# Patient Record
Sex: Female | Born: 1980 | Race: White | Hispanic: No | Marital: Single | Smoking: Never smoker
Health system: Southern US, Community
[De-identification: ages and names within clinical notes are randomized; demographics above are authoritative.]

## PROBLEM LIST (undated history)

## (undated) HISTORY — PX: CERVICAL CONE BIOPSY: SUR198

## (undated) HISTORY — PX: OTHER SURGICAL HISTORY: SHX169

## (undated) HISTORY — PX: OTOPLASTY: SUR998

---

## 1999-12-22 DIAGNOSIS — R87619 Unspecified abnormal cytological findings in specimens from cervix uteri: Secondary | ICD-10-CM | POA: Insufficient documentation

## 2010-01-03 ENCOUNTER — Ambulatory Visit: Payer: Self-pay | Admitting: Family Medicine

## 2011-11-11 ENCOUNTER — Ambulatory Visit: Payer: Self-pay | Admitting: Family Medicine

## 2014-10-31 ENCOUNTER — Encounter: Payer: Self-pay | Admitting: Sports Medicine

## 2014-10-31 ENCOUNTER — Ambulatory Visit (INDEPENDENT_AMBULATORY_CARE_PROVIDER_SITE_OTHER): Payer: BC Managed Care – PPO | Admitting: Sports Medicine

## 2014-10-31 VITALS — BP 115/73 | Ht 62.0 in | Wt 108.0 lb

## 2014-10-31 DIAGNOSIS — M79672 Pain in left foot: Secondary | ICD-10-CM | POA: Diagnosis not present

## 2014-10-31 DIAGNOSIS — M84375A Stress fracture, left foot, initial encounter for fracture: Secondary | ICD-10-CM

## 2014-10-31 NOTE — Patient Instructions (Signed)
Hip raises while laying on your side. Start 10 reps and work up to 3 sets of 30 on both sides  Calf stretches for the achilles.  Up with both feet and down on left foot over 5 seconds.

## 2014-10-31 NOTE — Progress Notes (Signed)
  Renee RivalChristina C Grippi - 33 y.o. female MRN [id number]  Date of birth: March 22, 1981  CC & HPI:  Trula OreChristina presents today on self-referral for evaluation of: Left foot pain: reports approximate 6 weeks of worsening left metatarsal foot pain. She had increased her activity over the summer and was participating in a regular running program. She's got to the point now that with any running greater than 5-10 minutes her foot is hurting and will continue to hurt following activity. Worse with the longer that she runs. Not waking her from sleep but bothersome when going to sleep. Has been taking anti-inflammatories.  Denies any skin changes, fever, chills or redness around the foot. Denies any pins and needles or electrical-like sensations.  ROS:  Per HPI.   HISTORY: Past Medical, Surgical, Social, and Family History Reviewed & Updated per EMR.  Pertinent Historical Findings include: Otherwise relatively healthy. Nonsmoker, social drinker, works as a Curatorhigher education consulting project manager.   OBJECTIVE:  VS:   HT:5\' 2"  (157.5 cm)   WT:108 lb (48.988 kg)  BMI:19.8          BP:115/73 mmHg  HR: bpm  TEMP: ( )  RESP:   PHYSICAL EXAM: GENERAL: Adult Caucasian  female. In no discomfort; no respiratory distress   PSYCH: alert and appropriate, good insight   NEURO: sensation is intact to light touch in bilateral lower extremities  VASCULAR: DP and PT pulses 2+/4.  No significant edema.   BILATERAL FOOT EXAM: Appearance: Overall normal-appearing, early bunion formation left greater than right Longitudinal Arch: moderate, flexible, maintains with weightbearing. Bilateral Morton's foot, no Morton's callus. Transverse Arch: left greater than right transverse arch breakdown Calcaneous position with weight bearing: neutral, slight valgus  Skin: No overlying erythema/ecchymosis.  Palpation: TTP over: distal metatarsal shaft of the third and fourth metatarsal  No TTP over: MTPs, first or second distal  metatarsal. Metatarsal Squeeze Test: negative  Strength & ROM: 5/5 Strength and full active ROM in: dorsiflexion, inversion, eversion.  Normal range of motion in toe motion, plantar flexion to 90 on left, 110 on right Bilateral hip abduction strengthening     Limited MSK Ultrasound of left foot: Findings: Increased neovascularity along the cortex of the distal third metatarsal. There is surrounding hypoechoic change with no cortical irregularity appreciated.  Impression: The above findings are consistent with early stress reaction of metatarsal    ASSESSMENT: 1. Left foot pain   2. Stress reaction of left foot, initial encounter     PLAN: See problem based charting & AVS for additional documentation. - patient provided green cushioned insoles today with additional metatarsal support bilaterally.  - Refrain from all running for the next 2 weeks.okay to try 5:1 WUJ:WJXBrun:walk in 2 weeks limiting to pain. Otherwise low impact activities encouraged. on pain levels. - HEP: hip abduction strengthening and Achilles stretching > Return in about 5 weeks (around 12/05/2014).

## 2014-11-01 DIAGNOSIS — M84375A Stress fracture, left foot, initial encounter for fracture: Secondary | ICD-10-CM | POA: Insufficient documentation

## 2014-12-12 ENCOUNTER — Ambulatory Visit: Payer: BC Managed Care – PPO | Admitting: Sports Medicine

## 2014-12-26 ENCOUNTER — Ambulatory Visit (INDEPENDENT_AMBULATORY_CARE_PROVIDER_SITE_OTHER): Payer: BLUE CROSS/BLUE SHIELD | Admitting: Sports Medicine

## 2014-12-26 ENCOUNTER — Encounter: Payer: Self-pay | Admitting: Sports Medicine

## 2014-12-26 VITALS — BP 107/71 | HR 54 | Ht 62.0 in | Wt 108.0 lb

## 2014-12-26 DIAGNOSIS — M84375A Stress fracture, left foot, initial encounter for fracture: Secondary | ICD-10-CM

## 2014-12-26 DIAGNOSIS — M79672 Pain in left foot: Secondary | ICD-10-CM | POA: Diagnosis not present

## 2014-12-26 NOTE — Patient Instructions (Signed)
2-3 minutes of running with 1 minute of walking Work up to 5 minutes by adding 1 minute each week until running 5 minutes with 1 minute of walking and start increasing your total time by 5 minutes each week.  Keep doing the leg lifts.

## 2014-12-26 NOTE — Progress Notes (Signed)
Rachel Valdez - 34 y.o. female MRN [id number]  Date of birth: [birth year]  CC & HPI:  Rachel Valdez is here to follow up: Left foot and ankle pain: Patient is here for six-week follow-up. She has not been running except for one time since her last visit and reports overall the pain in the metatarsal area of his her foot is improved however she has had some toe cramps as well as 3-4 episodes of sharp stabbing ankle pain and with twisting motions. No instability. She has been wearing the Green sports insoles with small metatarsal pads. She has been doing her hip abduction exercises has been participating in body pump without significant metatarsal pain but this does seem to exacerbate her foot cramps. Of note she is on spironolactone for acne and thinks this may be contributing to the cramping. She has never had cramps before.   ROS:  Per HPI.   HISTORY: Past Medical, Surgical, Social, and Family History Reviewed & Updated per EMR.  Pertinent Historical Findings include: Nonsmoker  Historical Data Reviewed: 10/31/2014: MSK ultrasound - increased neovascularity along the third metatarsal shaft consistent with early stress reaction  OBJECTIVE:  VS:   HT:5\' 2"  (157.5 cm)   WT:108 lb (48.988 kg)  BMI:19.8          BP:107/71 mmHg  HR:(!) 54bpm  TEMP: ( )  RESP:   PHYSICAL EXAM: GENERAL: Adult Caucasian  female. In no discomfort; no respiratory distress   PSYCH: alert and appropriate, good insight   NEURO: sensation is intact to light touch in bilateral lower extremities  VASCULAR: DP and PT pulses 2+/4.  No significant edema.   BILATERAL FOOT EXAM: Appearance: Overall normal-appearing, early bunion formation left greater than right Longitudinal Arch: moderate, flexible, maintains with weightbearing. Bilateral Morton's foot, no Morton's callus. Transverse Arch: left greater than right transverse arch breakdown Calcaneous position with weight bearing: neutral, slight valgus  Skin: No  overlying erythema/ecchymosis.  Palpation: No TTP over: Metatarsal shafts, MTPs, posterior tibialis, anterior tibialis, talar dome Metatarsal Squeeze Test: negative  Strength & ROM: 5/5 Strength and full active ROM in: dorsiflexion, inversion, eversion.  Somewhat limited left great toe motion otherwise normal toe motion.  Bilateral hip abduction strength 5/5 on the left, 5-/5 on the right.      Running Gait & Functional Exam:  General:  neutral stride without significant head bob   Strike/foot:  normal heel toe strike her, small amount of genu valgus that does not cross midline   Other:  neutral arm swelling      Limited MSK Ultrasound of left foot: Findings:  no hypoechoic change or increased neovascularity along the third or fourth metatarsal shafts.   Impression: The above findings are consistent with normal ultrasound of the 3rd and 4th  metatarsal shafts     ASSESSMENT: 1. Left foot pain   2. Stress reaction of left foot, initial encounter    Overall metatarsal stress reaction seems to be improved, toe cramping and ankle symptoms may possibly be from small metatarsal pad being to distal and this was adjusted today.  PLAN: See problem based charting & AVS for additional documentation. - Adjusted metatarsal pad. - Running program to resume per AVS. She does have the women's running group prescription that she would like to return to after the next 6 weeks.. - HEP: hip abduction strengthening and Achilles stretching > Return in about 6 weeks (around 02/06/2015).     > Consider custom orthotics given significant breakdown of transverse arch

## 2015-02-06 ENCOUNTER — Ambulatory Visit: Payer: BLUE CROSS/BLUE SHIELD | Admitting: Sports Medicine

## 2015-02-13 ENCOUNTER — Ambulatory Visit: Payer: BLUE CROSS/BLUE SHIELD | Admitting: Sports Medicine

## 2015-02-20 ENCOUNTER — Encounter: Payer: Self-pay | Admitting: Sports Medicine

## 2015-02-20 ENCOUNTER — Ambulatory Visit (INDEPENDENT_AMBULATORY_CARE_PROVIDER_SITE_OTHER): Payer: BLUE CROSS/BLUE SHIELD | Admitting: Sports Medicine

## 2015-02-20 VITALS — BP 122/88 | HR 84 | Ht 62.0 in | Wt 108.0 lb

## 2015-02-20 DIAGNOSIS — M205X2 Other deformities of toe(s) (acquired), left foot: Secondary | ICD-10-CM | POA: Diagnosis not present

## 2015-02-20 DIAGNOSIS — M2012 Hallux valgus (acquired), left foot: Secondary | ICD-10-CM

## 2015-02-20 DIAGNOSIS — M79672 Pain in left foot: Secondary | ICD-10-CM | POA: Diagnosis not present

## 2015-02-20 DIAGNOSIS — M7742 Metatarsalgia, left foot: Secondary | ICD-10-CM | POA: Diagnosis not present

## 2015-02-20 DIAGNOSIS — M21612 Bunion of left foot: Secondary | ICD-10-CM

## 2015-02-20 NOTE — Progress Notes (Signed)
Rachel Valdez - [age] y.o. female MRN [id number]  Date of birth: 1981/03/04  SUBJECTIVE: CC: Left foot pain, follow-up HPI: Reports overall stable lateral forefoot pain is worse with ambulation and activities. She has return to walking. Using Green sports insoles with small metatarsal pad has actually exacerbated her symptoms and she has discontinued these. Denies any shooting pain or numbness into the digits but does have pain in the first and third web spaces. Has previously done well with custom orthotics for her hiking boots but this was greater than 15 years ago. Bunion has been present since childhood.  ROS: Denies any numbness, tingling, temperature changes of the left foot.  HISTORY:  Past Medical, Surgical, Social, and Family History reviewed & updated per EMR.  Pertinent Historical Findings include:  reports that she has never smoked. She does not have any smokeless tobacco history on file.  OBJECTIVE:  VS:   HT:5\' 2"  (157.5 cm)   WT:108 lb (48.988 kg)  BMI:19.8          BP:122/88 mmHg  HR:84bpm  TEMP: ( )  RESP:   PHYSICAL EXAM:  GENERAL: Adult Caucasian athletic build female. No acute distress PSYCH: Alert and appropriately interactive. VASCULAR: DP and PT pulses 2+/4, no pretibial edema NEURO: Sensation intact in bilateral feet BILATERAL FOOT EXAM: Appearance: Overall normal-appearing, early bunion formation left greater than right Longitudinal Arch: moderate, flexible, maintains with weightbearing. Bilateral Morton's foot, no Morton's callus. Transverse Arch: left greater than right transverse arch breakdown with protrusion of the 3>2 metatarsal heads through the transverse arch capsule.  Calcaneous position with weight bearing: neutral, slight valgus  Skin: No overlying erythema/ecchymosis.  Palpation:  she does have a mild amount of pain over the third and fourth is still metatarsal shafts and marked pain over the second and third metatarsal heads   Strength  & ROM: 5/5 Strength and full active ROM in: dorsiflexion, inversion, eversion.  Somewhat limited left great toe motion otherwise normal toe motion.  Bilateral hip abduction strength 5/5 on the left, 5-/5 on the right.    ASSESSMENT: 1. Left foot pain   2. Hallux limitus of left foot   3. Bunion of great toe of left foot   4. Metatarsalgia of left foot    PROCEDURE NOTE: CUSTOM ORTHOTICS The patient was fitted for a standard, cushioned, semi-rigid orthotic. The orthotic was heated & placed on the orthotic stand. The patient was positioned in subtalar neutral position and 10 of ankle dorsiflexion and weight bearing stance some heated orthotic blank. After completion of the molding a stable paste was applied to the orthotic blank. The orthotic was ground to a stable position for weightbearing. The patient ambulated in these and reported they were comfortable without pressure spots.              BLANK:  Size 6 - thin cushioning standard orthotic                 BASE:  Blue EVA      POSTINGS:  1st ray post, left >50% of this 45 minute visit was spent in direct face to face evaluation, measurement and manufacture of custom molded orthotic.  PLAN: See problem based charting & AVS for additional documentation.  Museum/gallery exhibitions officerCustom Orthotics today.as above  Slow return to normal activities  If no significant improvement or worsening over the next 4 weeks will have patient follow-up consider further advanced imaging of the foot if persistent pain. > Return if symptoms worsen or fail  to improve.

## 2015-05-24 ENCOUNTER — Ambulatory Visit (INDEPENDENT_AMBULATORY_CARE_PROVIDER_SITE_OTHER): Payer: BLUE CROSS/BLUE SHIELD | Admitting: Family Medicine

## 2015-05-24 ENCOUNTER — Encounter: Payer: Self-pay | Admitting: Family Medicine

## 2015-05-24 VITALS — BP 119/86 | HR 55 | Ht 62.0 in | Wt 108.0 lb

## 2015-05-24 DIAGNOSIS — M25532 Pain in left wrist: Secondary | ICD-10-CM | POA: Diagnosis not present

## 2015-05-24 DIAGNOSIS — M84375S Stress fracture, left foot, sequela: Secondary | ICD-10-CM | POA: Diagnosis not present

## 2015-05-24 NOTE — Progress Notes (Signed)
   Subjective:    Patient ID: Rachel Valdez, female    DOB: 12-27-1980, 34 y.o.   MRN: [id number]  HPI Follow-up left foot pain. Diagnosed with stress fracture. Had some orthotics made. Does or not doing very well for her. Feels like they actually make her foot hurt worse. She's been wearing them regularly however. Foot continues to hurt on the forefoot, also somewhat on the lateral foot. Feels like her orthotics tilt her foot a little bit too much. #2. Has noticed very mild and intermittent left wrist pain. Notices it with pushups and if she holds exercise weights a certain way. Not hurting her today. She is right-hand dominant. She's had no history of left wrist injury.   Review of Systems No unusual joint swelling, no joint erythema, no fever, sweats, chills.    Objective:   Physical Exam  Vital signs are reviewed GEN.: Well-developed female no acute distress FEET: Varus forefoot. Mild to palpation over the area of the third and fourth metatarsal head but no specific area of tenderness. Negative squeeze test. WRIST: Left. Full range of motion flexion extension with normal strength. Motion is painless. No snuffbox tenderness. The MCP joint of the thumb is nontender, no erythema, no warmth, no synovial thickening. GAIT: Running. Midfoot/toe Striker. Short stride length. She's a little bit knock kneed which causes her right foot to the circumlocution on her swing phase.      Assessment & Plan:  #1. History of stress reaction in the left foot. I reviewed her notes and talked her about her rather quick ramp-up from a non-runner to running 6 miles daily. Not sure the stress reaction was secondary to structural problem. Seems more likely was due to to rapid increase in mileage. I modified her orthotics a little bit today. Not sure she really is going to benefit from orthotics and if these don't feel better than I would have her go back to the regular shoe insert and see how that feels. We  talked about increasing only 5% every one to 2 weeks. I think if she'll be a little more cognizant of rapid mileage increases she'll probably do fine. #2. Wrist pain. She may have some mild weakness in that wrist because it's her nondominant hand. I'll have her do some wrist flexion extension exercises with 2.5 lb weight. If that doesn't help or she starts having more problems to return to clinic. Alternatively I thought maybe her pain will be coming from the thumb MCP but I could not elicit any pain at all today.

## 2015-12-04 ENCOUNTER — Telehealth: Payer: Self-pay | Admitting: Family Medicine

## 2015-12-04 MED ORDER — AMOXICILLIN-POT CLAVULANATE 875-125 MG PO TABS: 1.0000 | ORAL_TABLET | Freq: Two times a day (BID) | ORAL | Status: DC

## 2015-12-04 NOTE — Telephone Encounter (Signed)
[place] and thinks she may have a sinus infection. [facility] [address] Phone# [phone]. Symptoms: Fever, chills, aches and pain, sore throat, cough, & congestion. Please advise. Thanks TNP

## 2015-12-04 NOTE — Telephone Encounter (Signed)
RX called in at [facility] pharmacy. Patient is aware.  

## 2015-12-04 NOTE — Telephone Encounter (Signed)
Ok to call in rx.  Thanks.  

## 2016-04-09 ENCOUNTER — Ambulatory Visit (INDEPENDENT_AMBULATORY_CARE_PROVIDER_SITE_OTHER): Payer: BLUE CROSS/BLUE SHIELD | Admitting: Family Medicine

## 2016-04-09 ENCOUNTER — Encounter: Payer: Self-pay | Admitting: Family Medicine

## 2016-04-09 VITALS — BP 124/80 | HR 84 | Temp 98.8°F | Resp 16 | Wt 112.0 lb

## 2016-04-09 DIAGNOSIS — J069 Acute upper respiratory infection, unspecified: Secondary | ICD-10-CM | POA: Diagnosis not present

## 2016-04-09 DIAGNOSIS — J0100 Acute maxillary sinusitis, unspecified: Secondary | ICD-10-CM | POA: Diagnosis not present

## 2016-04-09 DIAGNOSIS — T7029XA Other effects of high altitude, initial encounter: Secondary | ICD-10-CM

## 2016-04-09 DIAGNOSIS — T7020XA Unspecified effects of high altitude, initial encounter: Secondary | ICD-10-CM | POA: Insufficient documentation

## 2016-04-09 DIAGNOSIS — M2550 Pain in unspecified joint: Secondary | ICD-10-CM | POA: Diagnosis not present

## 2016-04-09 DIAGNOSIS — J309 Allergic rhinitis, unspecified: Secondary | ICD-10-CM

## 2016-04-09 DIAGNOSIS — G43009 Migraine without aura, not intractable, without status migrainosus: Secondary | ICD-10-CM | POA: Insufficient documentation

## 2016-04-09 DIAGNOSIS — M79609 Pain in unspecified limb: Secondary | ICD-10-CM | POA: Insufficient documentation

## 2016-04-09 DIAGNOSIS — K209 Esophagitis, unspecified without bleeding: Secondary | ICD-10-CM | POA: Insufficient documentation

## 2016-04-09 DIAGNOSIS — K649 Unspecified hemorrhoids: Secondary | ICD-10-CM | POA: Insufficient documentation

## 2016-04-09 MED ORDER — FLUTICASONE PROPIONATE 50 MCG/ACT NA SUSP: 2.0000 | Freq: Every day | NASAL | Status: AC

## 2016-04-09 NOTE — Progress Notes (Signed)
Patient ID: Rachel Valdez, female   DOB: [birth year], 35 y.o.   MRN: [id number]        Patient: Rachel Valdez Female    DOB: 1981/10/27   35 y.o.   MRN: [id number] Visit Date: 04/09/2016  Today's Provider: Margarita Rana, MD   Chief Complaint  Patient presents with  . Sinusitis  . URI   Subjective:    Sinusitis This is a new problem. The current episode started in the past 7 days. The problem has been gradually improving since onset. There has been no fever. Associated symptoms include congestion, coughing, headaches, shortness of breath, sinus pressure and a sore throat. Pertinent negatives include no chills, diaphoresis, ear pain (Ears feels stopped up.  ) or sneezing. Past treatments include acetaminophen and oral decongestants.  URI  This is a new problem. The current episode started 1 to 4 weeks ago. The problem has been gradually improving. Associated symptoms include congestion, coughing, headaches, nausea, rhinorrhea and a sore throat. Pertinent negatives include no abdominal pain, diarrhea, ear pain (Ears feels stopped up.  ), sneezing, vomiting or wheezing. She has tried decongestant, antihistamine and acetaminophen for the symptoms. The treatment provided no relief.   Also complaining of joint pain in LE. Started at ankles, but also in hip and knees at times.      Allergies  Allergen Reactions  . Doxycycline    Previous Medications   PIMTREA 0.15-0.02/0.01 MG (21/5) TABLET       SPIRONOLACTONE (ALDACTONE) 50 MG TABLET        Review of Systems  Constitutional: Positive for fever. Negative for chills, diaphoresis, activity change, appetite change, fatigue and unexpected weight change.  HENT: Positive for congestion, postnasal drip, rhinorrhea, sinus pressure and sore throat. Negative for ear discharge, ear pain (Ears feels stopped up.  ), nosebleeds, sneezing, tinnitus, trouble swallowing and voice change.   Eyes: Negative for photophobia, pain, discharge,  redness, itching and visual disturbance.  Respiratory: Positive for cough and shortness of breath. Negative for apnea, choking, chest tightness, wheezing and stridor.   Cardiovascular: Negative.   Gastrointestinal: Positive for nausea. Negative for vomiting, abdominal pain, diarrhea, constipation, blood in stool, abdominal distention, anal bleeding and rectal pain.  Neurological: Positive for dizziness and headaches. Negative for light-headedness.    Social History  Substance Use Topics  . Smoking status: Never Smoker   . Smokeless tobacco: Never Used  . Alcohol Use: No   Objective:   BP 124/80 mmHg  Pulse 84  Temp(Src) 98.8 F (37.1 C) (Oral)  Resp 16  Wt 112 lb (50.803 kg)  LMP 04/07/2016 (Exact Date)  Physical Exam  Constitutional: She appears well-developed and well-nourished.  HENT:  Head: Normocephalic and atraumatic.  Right Ear: External ear and ear canal normal. A middle ear effusion is present.  Left Ear: External ear and ear canal normal. A middle ear effusion is present.  Nose: Mucosal edema (Pale) present.  Cardiovascular: Normal rate, regular rhythm and normal heart sounds.   Pulmonary/Chest: Effort normal and breath sounds normal.  Psychiatric: She has a normal mood and affect. Her behavior is normal. Judgment and thought content normal.        Assessment & Plan:   1. Allergic rhinitis, unspecified allergic rhinitis type Worsening will restarted a nasal steroid.  Pt has used Nasonex in the past but insurance will on cover it.  Will try Flonase.  Pt advised to call if worsening or not improved.   - fluticasone (FLONASE) 50 MCG/ACT nasal  spray; Place 2 sprays into both nostrils daily.  Dispense: 16 g; Refill: 6  2. Pain in joint Pt reports having a lot of "Abnormal Joint Pain"  Now has pain in her left knee and hip as well as a chronic ankle pain.  Work was negative in the past; but will recheck today.   - Sed Rate (ESR) - Uric acid - Rheumatoid factor -  Antinuclear Antib (ANA) - CYCLIC CITRUL PEPTIDE ANTIBODY, IGG/IGA   Patient was seen and examined by Jerrell Belfast, MD, and note scribed by Ashley Royalty, CMA.  I have reviewed the document for accuracy and completeness and I agree with above. - Jerrell Belfast, MD      Margarita Rana, MD  [facility]

## 2016-04-11 LAB — URIC ACID: Uric Acid: 3.9 mg/dL (ref 2.5–7.1)

## 2016-04-11 LAB — CYCLIC CITRUL PEPTIDE ANTIBODY, IGG/IGA: Cyclic Citrullin Peptide Ab: 7 units (ref 0–19)

## 2016-04-11 LAB — SEDIMENTATION RATE: Sed Rate: 4 mm/hr (ref 0–32)

## 2016-04-11 LAB — RHEUMATOID FACTOR: Rhuematoid fact SerPl-aCnc: <10 IU/mL (ref 0.0–13.9)

## 2016-04-11 LAB — ANA: Anti Nuclear Antibody(ANA): NEGATIVE

## 2016-04-13 ENCOUNTER — Telehealth: Payer: Self-pay

## 2016-04-13 NOTE — Telephone Encounter (Signed)
LMTCB. sd  

## 2016-04-13 NOTE — Telephone Encounter (Signed)
-----   Message from Lorie PhenixNancy Maloney, MD sent at 04/11/2016  9:02 AM EDT ----- Labs stable. No signs of inflammatory arthritis. Thanks.

## 2016-04-15 NOTE — Telephone Encounter (Signed)
LMTCB 04/15/2016  Thanks,   -Renleigh Ouellet  

## 2016-04-24 NOTE — Telephone Encounter (Signed)
Pt advised.   Thanks,   -Keenon Leitzel  

## 2016-08-31 ENCOUNTER — Ambulatory Visit (INDEPENDENT_AMBULATORY_CARE_PROVIDER_SITE_OTHER): Payer: BLUE CROSS/BLUE SHIELD | Admitting: Sports Medicine

## 2016-08-31 ENCOUNTER — Ambulatory Visit
Admission: RE | Admit: 2016-08-31 | Discharge: 2016-08-31 | Disposition: A | Discharge status: Home / Self Care | Payer: BLUE CROSS/BLUE SHIELD | Source: Ambulatory Visit | Attending: Sports Medicine | Admitting: Sports Medicine

## 2016-08-31 ENCOUNTER — Encounter: Payer: Self-pay | Admitting: Sports Medicine

## 2016-08-31 ENCOUNTER — Other Ambulatory Visit: Payer: Self-pay

## 2016-08-31 VITALS — BP 115/63 | Ht 63.0 in | Wt 110.0 lb

## 2016-08-31 DIAGNOSIS — M79672 Pain in left foot: Secondary | ICD-10-CM | POA: Diagnosis not present

## 2016-08-31 DIAGNOSIS — M84375S Stress fracture, left foot, sequela: Secondary | ICD-10-CM

## 2016-08-31 NOTE — Progress Notes (Signed)
    Subjective:  Rachel Valdez is a 35 y.o. female who [facility] with a chief complaint of left second toe pain.   HPI:  Left Second Toe Pain Symptoms started this morning after waking up. Pain mostly located on the superior aspect of her left 2nd MTP joint. Initially was described as a sharp, stabbing pain "like 1000 needles" but since then has turned into a throbbing pain. No swelling or redness. Pain is worse when she bends her toes while walking. She has not tried any medications or other treatments. She went hiking over the weekend, but has not done anything else out of the ordinary. No injuries or other obvious precipitating events.  ROS: No fevers or chills. Otherwise per HPI.   Objective:  Physical Exam: BP 115/63   Ht 5\' 3"  (1.6 m)   Wt 110 lb (49.9 kg)   BMI 19.49 kg/m   Gen: NAD, resting comfortably Pulm: NWOB MSK:  - Left foot: No deformities, erythema, or effusions. Mildly tender to palpation over 2nd MTP joint. FROM in all toes. Mild pain with passive extension of left 2nd toe. DP pulse 2+ Skin: warm, dry Neuro: grossly normal, moves all extremities Psych: Normal affect and thought content  Bedside Ultrasound: Normal appearing left 2nd MTP joint and extensor tendons.  Assessment/Plan:  Left Second Toe Pain Unclear etiology. No signs of gout or bony abnormality on ultrasound. No signs of soft tissue swelling. Likely mild tenosynovitis. Will treat conservatively with post-op shoe and topical aspercreme. Return precautions reviewed. Follow up as needed.   Will also check left foot plain film given history of stress reactions in the past that still has not completely returned to normal.   Katina Degreealeb M. Jimmey RalphParker, MD [facility] Resident PGY-3 08/31/2016 11:21 AM   Patient seen and evaluated with the resident. I agree with the above plan of care. X-rays were reviewed which do not show any evidence of a nonunited stress fracture. She does  have advanced first MTP degenerative changes for her age. Official ultrasound findings are as below. Patient is reassured that symptoms should resolve quickly. Follow-up for ongoing or recalcitrant issues.  MSK ultrasound of the left foot with attention to the left second MTP joint was performed. Two separate images of the MTP joint were obtained. Both views were in the longitudinal plane. Joint space is unremarkable. Extensor tendon is also unremarkable. No evidence of cortical irregularity to suggest stress fracture. This is an unremarkable study.

## 2017-11-03 IMAGING — CR DG FOOT COMPLETE 3+V*L*
3 series · 3 of 3 positions shown · non-contrast
Comparison: None.

CLINICAL DATA: Left second toe pain today. No known injury. Initial
encounter.

EXAM:
LEFT FOOT - COMPLETE 3+ VIEW

[view not recorded (1 of 3)]
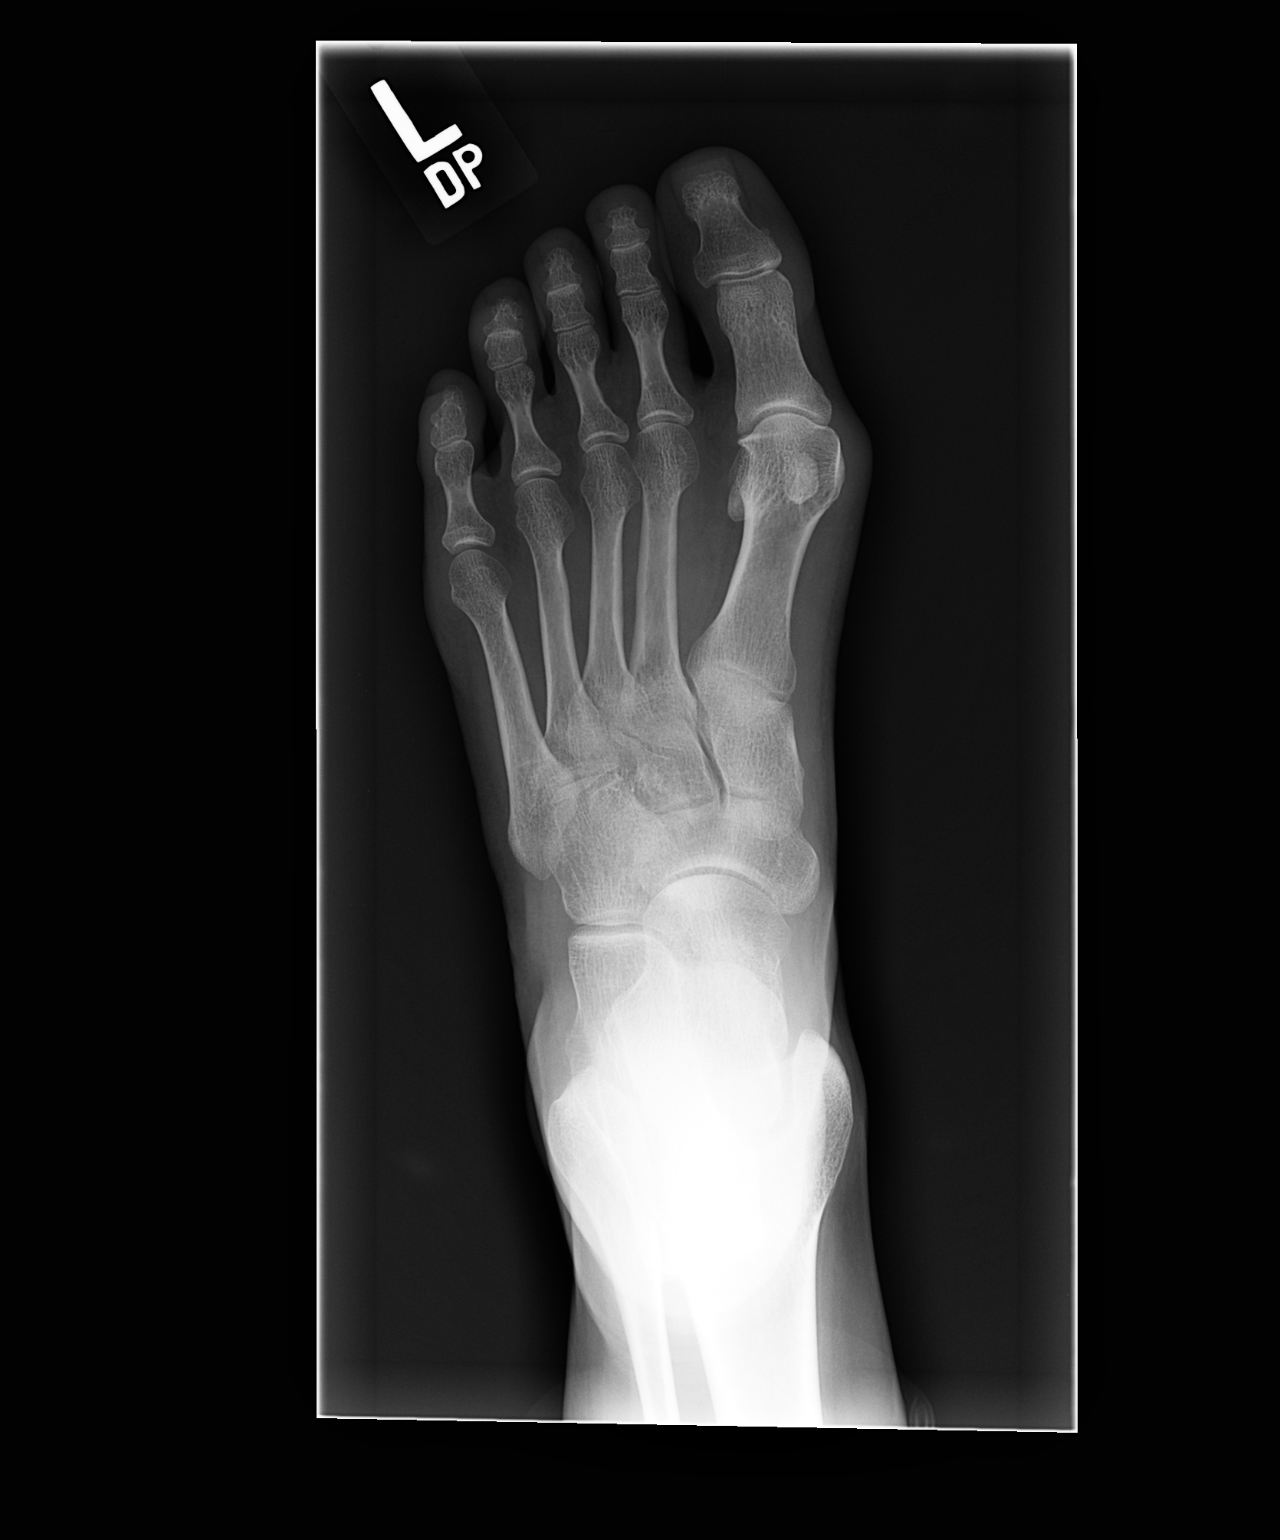

[view not recorded (2 of 3)]
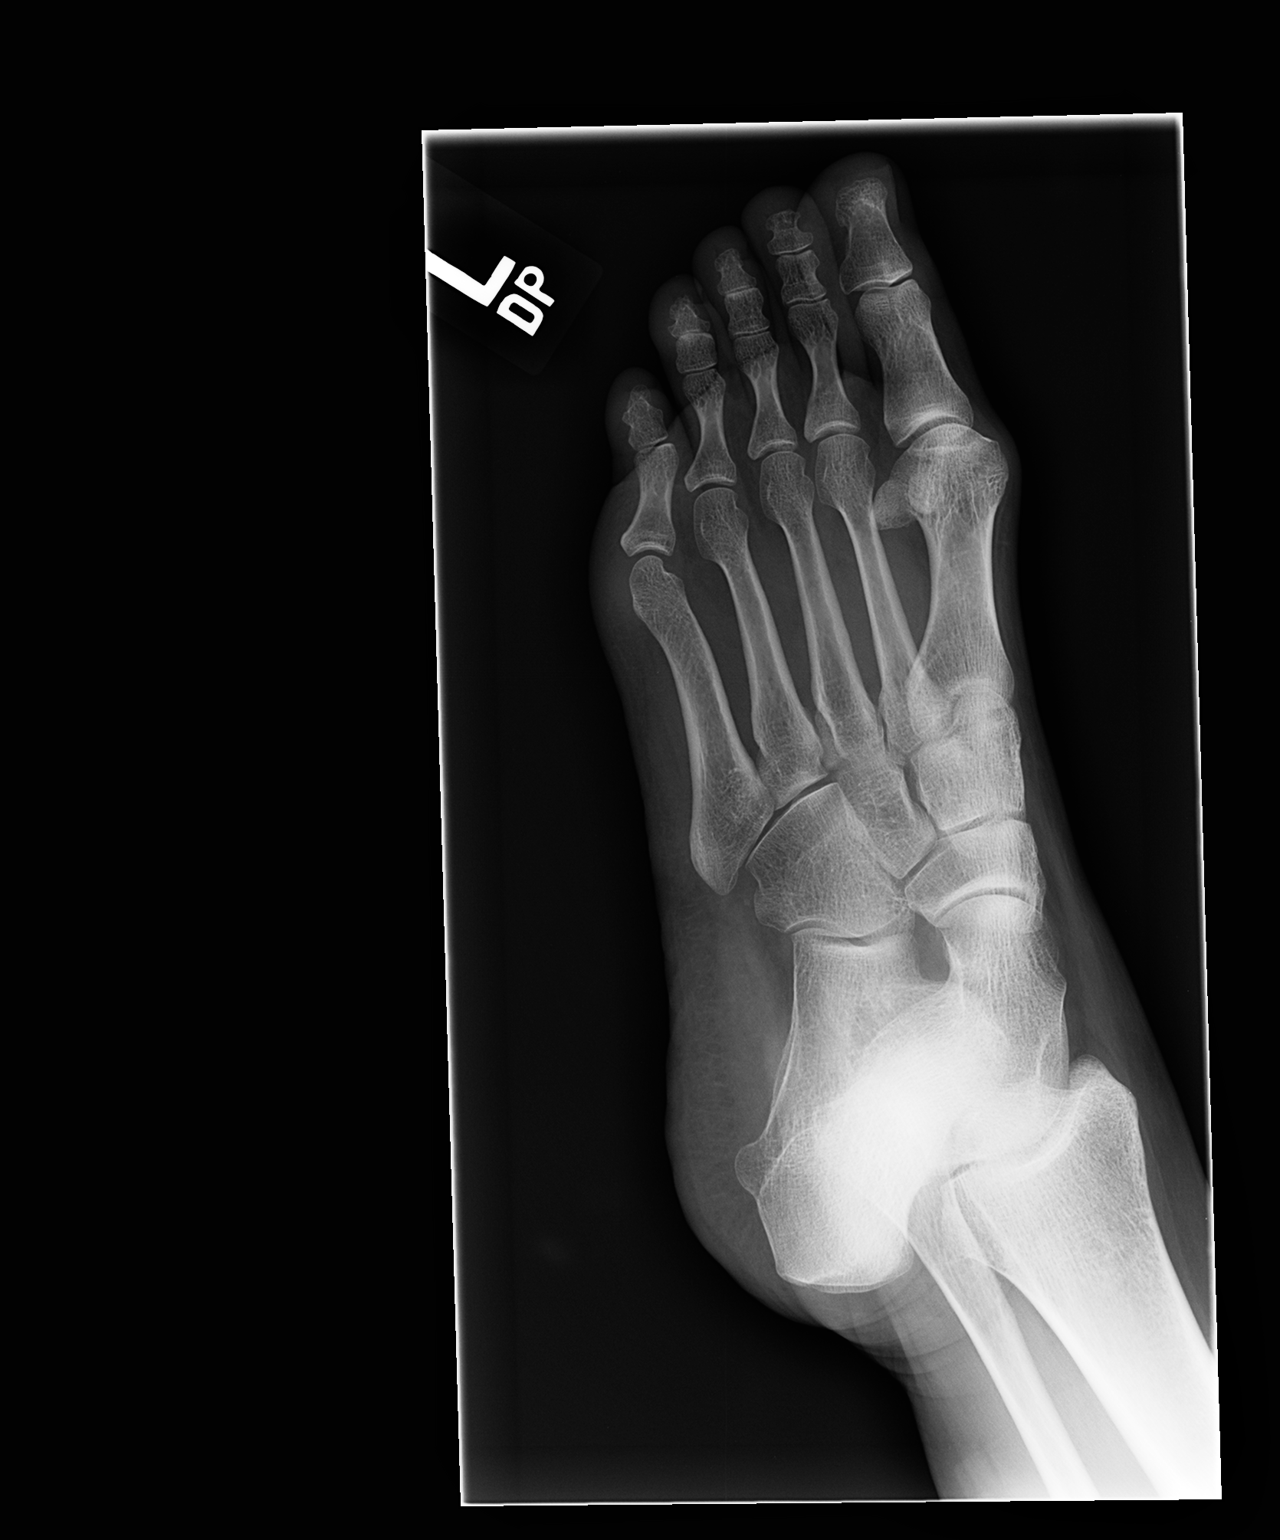

[view not recorded (3 of 3)]
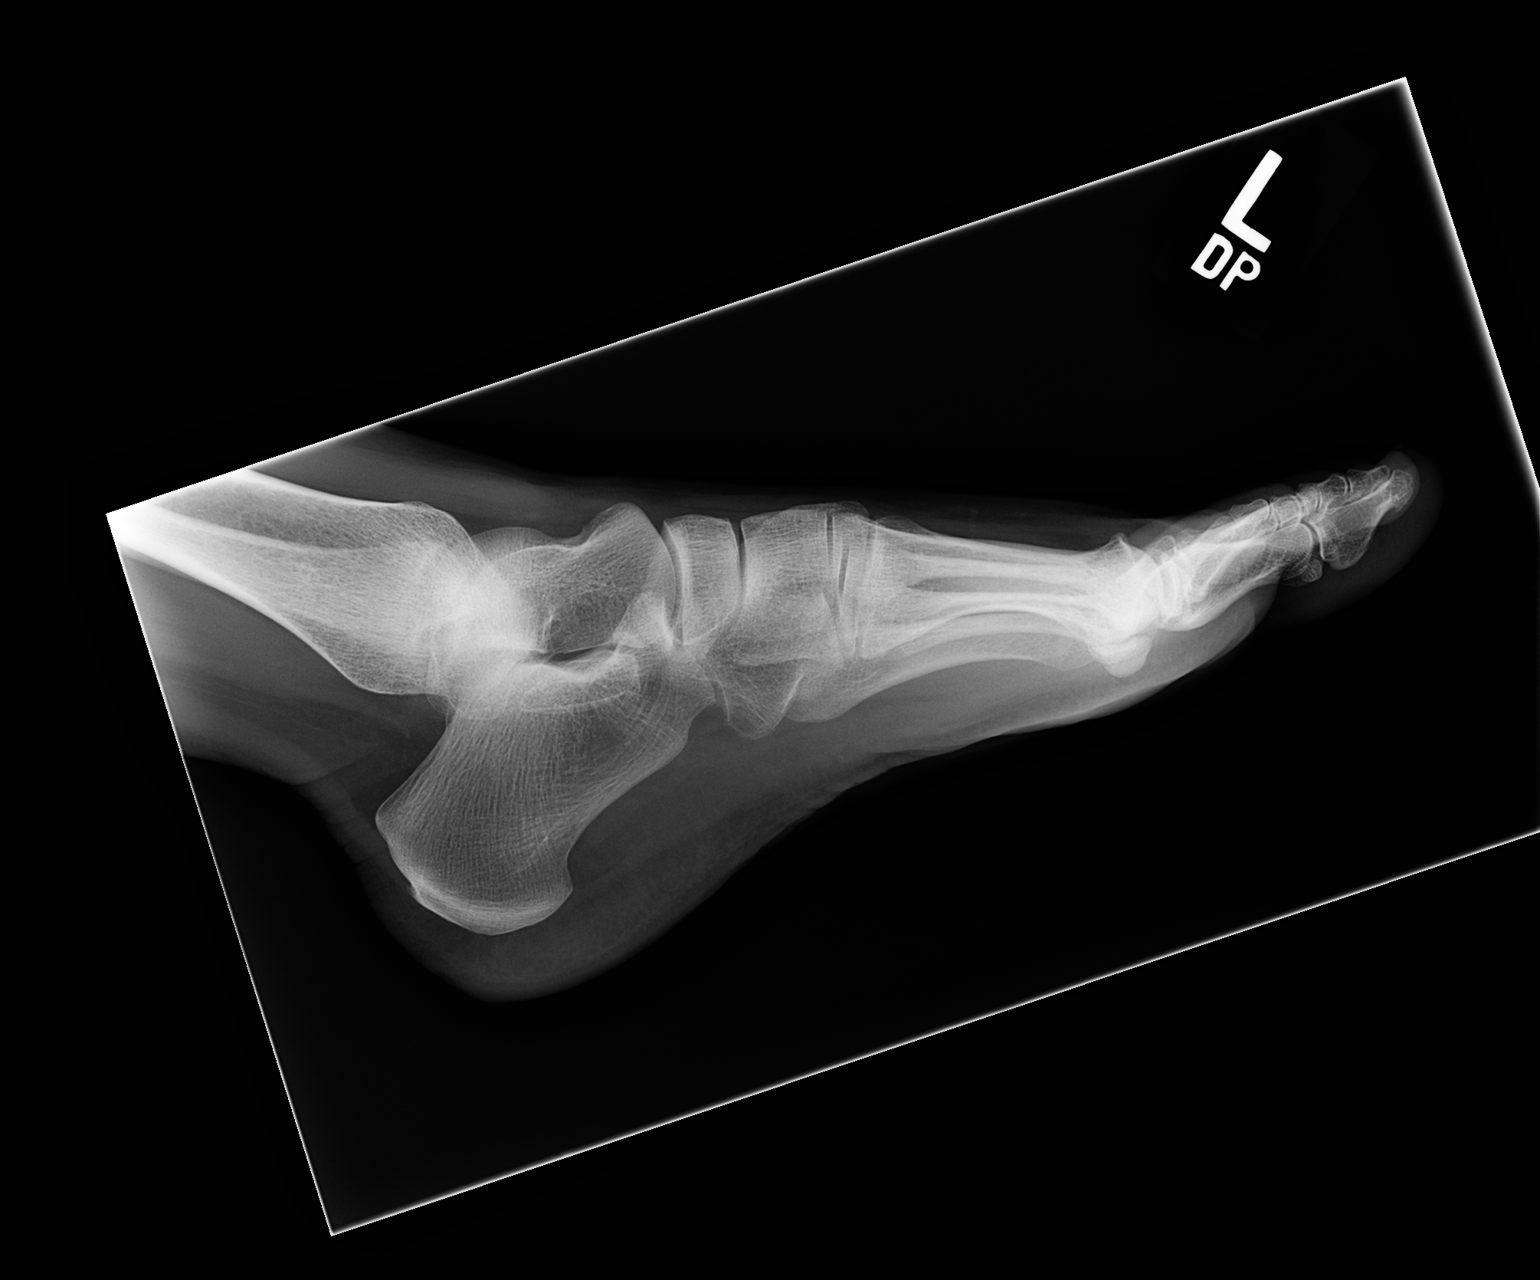

[3 of 3 positions shown; findings below may reference images not displayed]

FINDINGS: No acute bony or joint abnormality is identified. Moderate first MTP
osteoarthritis appears advanced for age. Soft tissues are
unremarkable.
IMPRESSION: No acute abnormality.

First MTP osteoarthritis which appears advanced for age.
